# Patient Record
Sex: Male | Born: 1945 | Race: White | Hispanic: No | Marital: Married | Smoking: Current some day smoker
Health system: Southern US, Community
[De-identification: ages and names within clinical notes are randomized; demographics above are authoritative.]

## PROBLEM LIST (undated history)

## (undated) DIAGNOSIS — G4730 Sleep apnea, unspecified: Secondary | ICD-10-CM

## (undated) DIAGNOSIS — I2510 Atherosclerotic heart disease of native coronary artery without angina pectoris: Secondary | ICD-10-CM

## (undated) DIAGNOSIS — Z8659 Personal history of other mental and behavioral disorders: Secondary | ICD-10-CM

## (undated) DIAGNOSIS — J45909 Unspecified asthma, uncomplicated: Secondary | ICD-10-CM

## (undated) DIAGNOSIS — C61 Malignant neoplasm of prostate: Secondary | ICD-10-CM

## (undated) DIAGNOSIS — I252 Old myocardial infarction: Secondary | ICD-10-CM

## (undated) DIAGNOSIS — E119 Type 2 diabetes mellitus without complications: Secondary | ICD-10-CM

## (undated) DIAGNOSIS — Z8669 Personal history of other diseases of the nervous system and sense organs: Secondary | ICD-10-CM

## (undated) DIAGNOSIS — M1990 Unspecified osteoarthritis, unspecified site: Secondary | ICD-10-CM

## (undated) DIAGNOSIS — I10 Essential (primary) hypertension: Secondary | ICD-10-CM

## (undated) HISTORY — PX: TRANSURETHRAL RESECTION OF PROSTATE: SHX73

## (undated) HISTORY — PX: PROSTATE BIOPSY: SHX241

---

## 2008-06-09 HISTORY — PX: CORONARY ANGIOPLASTY WITH STENT PLACEMENT: SHX49

## 2010-03-29 ENCOUNTER — Ambulatory Visit (HOSPITAL_COMMUNITY): Admission: RE | Admit: 2010-03-29 | Discharge: 2010-03-30 | Payer: Self-pay | Admitting: Urology

## 2010-08-21 LAB — BASIC METABOLIC PANEL
BUN: 18 mg/dL (ref 6–23)
CO2: 28 mEq/L (ref 19–32)
Chloride: 99 mEq/L (ref 96–112)
Creatinine, Ser: 1.03 mg/dL (ref 0.4–1.5)
GFR calc Af Amer: >60 mL/min (ref >60)
Glucose, Bld: 159 mg/dL — ABNORMAL HIGH (ref 70–99)

## 2010-08-21 LAB — CBC
MCH: 29.6 pg (ref 26.0–34.0)
MCV: 86.5 fL (ref 78.0–100.0)
Platelets: 265 10*3/uL (ref 150–400)
RBC: 4.61 MIL/uL (ref 4.22–5.81)

## 2010-08-21 LAB — GLUCOSE, CAPILLARY
Glucose-Capillary: 104 mg/dL — ABNORMAL HIGH (ref 70–99)
Glucose-Capillary: 216 mg/dL — ABNORMAL HIGH (ref 70–99)

## 2012-01-18 IMAGING — CR DG CHEST 2V
2 series · 2 of 2 positions shown · non-contrast
Comparison: None

CLINICAL DATA: Preoperative respiratory examination for prostate
surgery.

CHEST - 2 VIEW

[w chest pa]
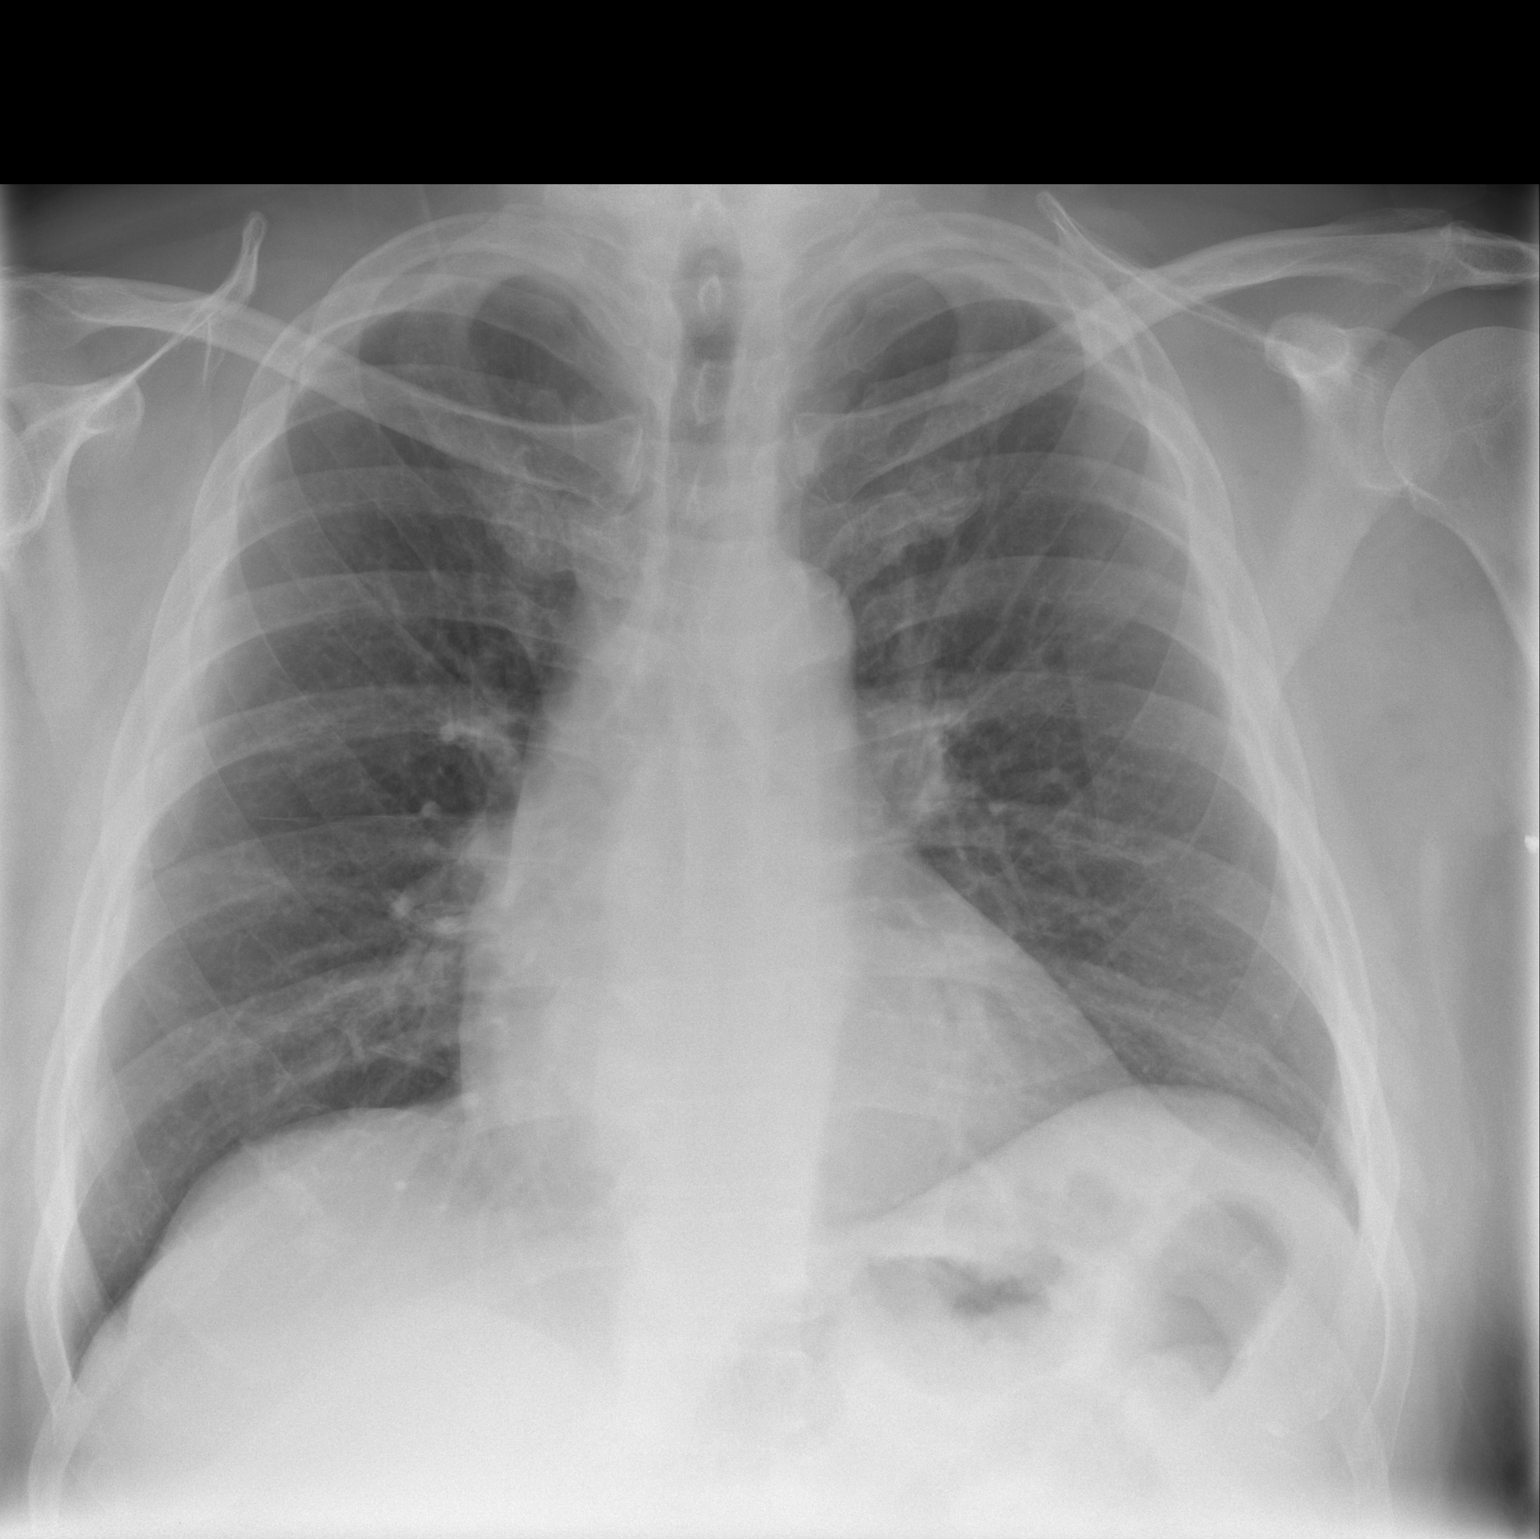

[w chest lat]
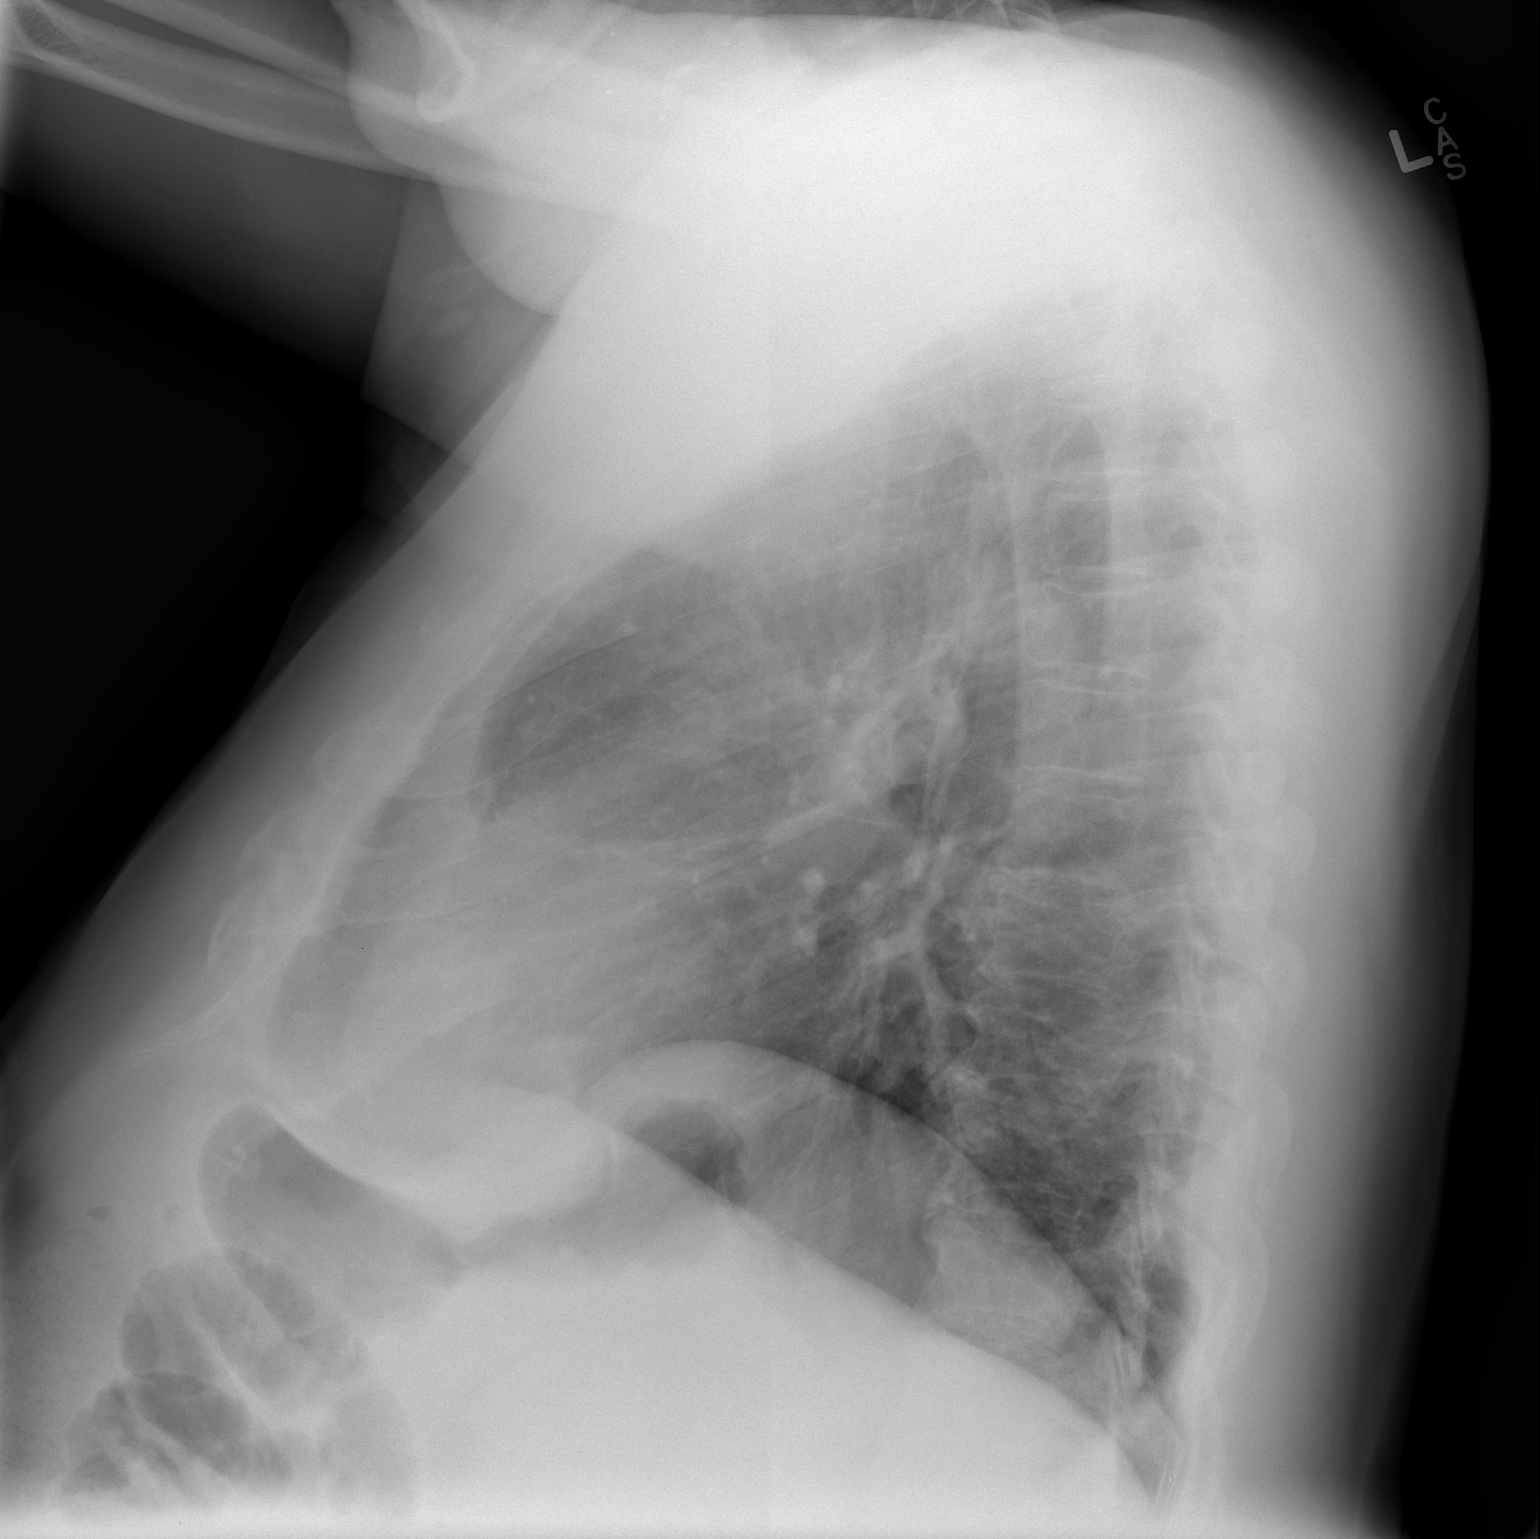

[2 of 2 positions shown; findings below may reference images not displayed]

FINDINGS: The cardiomediastinal silhouette is unremarkable.
The lungs are clear.
There is no evidence of focal airspace disease, pulmonary edema,
pleural effusion, or pneumothorax.
No acute bony abnormalities are identified.
IMPRESSION: No evidence of active cardiopulmonary disease.

## 2014-06-13 HISTORY — PX: PROSTATE BIOPSY: SHX241

## 2014-06-20 ENCOUNTER — Encounter: Payer: Self-pay | Admitting: Medical Oncology

## 2014-06-20 NOTE — [facility] Oncology Navigator Note (Signed)
I called Mr.Klahn and spoke with his wife. She states pt is out of town today but I can reach him around noon tomorrow.I will call patient back tomorrow to discuss his referral to the [facility].    Cira Rue, RN, BSN, [place]  [phone] Fax [phone]

## 2014-06-21 ENCOUNTER — Encounter: Payer: Self-pay | Admitting: Medical Oncology

## 2014-06-21 NOTE — [facility] Oncology Navigator Note (Signed)
I had called Mr. Cory Fields yesterday and requested he return my call. He call today and I  introduced myself as the Prostate Nurse Navigator and the Coordinator of the Prostate [place].  1. I confirmed with the patient he is aware of his referral to the clinic 07/04/14 at 12:30pm.   2. I discussed the format of the clinic and the physicians he will be seeing that day.  3. I discussed where the clinic is located and how to contact me.I asked him to please eat lunch before he comes due to the length of the clinic.  4. I confirmed his address and informed him I would be mailing a packet of information and forms to be completed. I asked him to bring them with him the day of his appointment.   He voiced understanding of the above. He asked me to give his wife my name and number which I did. I asked them both  to call me if they have any questions or concerns regarding his appointments or the forms he needs to complete.They both voiced understanding.  Cira Rue, RN, BSN, [place]  [phone]  Fax [phone]

## 2014-07-03 ENCOUNTER — Encounter: Payer: Self-pay | Admitting: Medical Oncology

## 2014-07-03 ENCOUNTER — Encounter: Payer: Self-pay | Admitting: Radiation Oncology

## 2014-07-03 NOTE — Progress Notes (Addendum)
GU Location of Tumor / Histology: Prostate Cancer  06/13/14 Biopsy     If Prostate Cancer, Gleason Score is (3+ 4) and PSA is (1.86), I-PSS score 30, SHIM score 18  Cory Fields was diagnosed with low risk prostate cancer "about 2009 and began active surveillance with [facility] until transferring his care to Dr. Raynelle Bring in June of 2011   Past/Anticipated interventions by urology, if any: Prostate Biopsy  Past/Anticipated interventions by medical oncology, if any:   Weight changes, if any: patient states he has gained 5 lbs according to todays weight , but had on clothes and shoes  Bowel/Bladder complaints, if any: "Longstanding BPH".  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  Symptoms worsened and had a repeat urodynamic study.  On Finasteride, and Tamsulosin restarted in April 2015  Nausea/Vomiting, if any: No  Pain issues, if any: No  SAFETY ISSUES:  Prior radiation? NO  Pacemaker/ICD?No  Possible current pregnancy? N/A  Is the patient on methotrexate? No  Current Complaints / other details:

## 2014-07-03 NOTE — [facility] Oncology Navigator Note (Signed)
I called Mr. Regina and spoke his wife. I confirmed pt's appointment for the [facility] 07/04/14 to arrive at 12:30pm. I explained that they will register in Radiation Oncology and someone at the front desk will direct them. I asked them to bring his completed medical forms and to eat lunch due to the length of the clinic. We reviewed the directions to [facility] and I asked her to call me with any questions or concerns before tomorrow's appointment. She voiced understanding.     Cira Rue, RN, BSN, [place]  [phone] Fax [phone]

## 2014-07-04 ENCOUNTER — Encounter: Payer: Self-pay | Admitting: Radiation Oncology

## 2014-07-04 ENCOUNTER — Ambulatory Visit (HOSPITAL_BASED_OUTPATIENT_CLINIC_OR_DEPARTMENT_OTHER): Payer: Medicare Other | Admitting: Oncology

## 2014-07-04 ENCOUNTER — Ambulatory Visit
Admission: RE | Admit: 2014-07-04 | Discharge: 2014-07-04 | Disposition: A | Discharge status: Home / Self Care | Payer: Medicare Other | Source: Ambulatory Visit | Attending: Radiation Oncology | Admitting: Radiation Oncology

## 2014-07-04 ENCOUNTER — Encounter: Payer: Self-pay | Admitting: Medical Oncology

## 2014-07-04 VITALS — BP 120/70 | HR 93 | Temp 98.4°F | Ht 70.0 in | Wt 247.8 lb

## 2014-07-04 DIAGNOSIS — I2510 Atherosclerotic heart disease of native coronary artery without angina pectoris: Secondary | ICD-10-CM

## 2014-07-04 DIAGNOSIS — N3281 Overactive bladder: Secondary | ICD-10-CM | POA: Diagnosis not present

## 2014-07-04 DIAGNOSIS — F329 Major depressive disorder, single episode, unspecified: Secondary | ICD-10-CM | POA: Diagnosis not present

## 2014-07-04 DIAGNOSIS — Z794 Long term (current) use of insulin: Secondary | ICD-10-CM | POA: Diagnosis not present

## 2014-07-04 DIAGNOSIS — I252 Old myocardial infarction: Secondary | ICD-10-CM | POA: Diagnosis not present

## 2014-07-04 DIAGNOSIS — E119 Type 2 diabetes mellitus without complications: Secondary | ICD-10-CM | POA: Insufficient documentation

## 2014-07-04 DIAGNOSIS — C61 Malignant neoplasm of prostate: Secondary | ICD-10-CM

## 2014-07-04 DIAGNOSIS — F17210 Nicotine dependence, cigarettes, uncomplicated: Secondary | ICD-10-CM | POA: Diagnosis not present

## 2014-07-04 DIAGNOSIS — Z7902 Long term (current) use of antithrombotics/antiplatelets: Secondary | ICD-10-CM | POA: Diagnosis not present

## 2014-07-04 DIAGNOSIS — Z9861 Coronary angioplasty status: Secondary | ICD-10-CM | POA: Diagnosis not present

## 2014-07-04 DIAGNOSIS — I10 Essential (primary) hypertension: Secondary | ICD-10-CM

## 2014-07-04 DIAGNOSIS — Z7982 Long term (current) use of aspirin: Secondary | ICD-10-CM | POA: Diagnosis not present

## 2014-07-04 DIAGNOSIS — G4730 Sleep apnea, unspecified: Secondary | ICD-10-CM | POA: Insufficient documentation

## 2014-07-04 DIAGNOSIS — J45909 Unspecified asthma, uncomplicated: Secondary | ICD-10-CM | POA: Diagnosis not present

## 2014-07-04 HISTORY — DX: Unspecified asthma, uncomplicated: J45.909

## 2014-07-04 HISTORY — DX: Type 2 diabetes mellitus without complications: E11.9

## 2014-07-04 HISTORY — DX: Essential (primary) hypertension: I10

## 2014-07-04 HISTORY — DX: Personal history of other diseases of the nervous system and sense organs: Z86.69

## 2014-07-04 HISTORY — DX: Unspecified osteoarthritis, unspecified site: M19.90

## 2014-07-04 HISTORY — DX: Sleep apnea, unspecified: G47.30

## 2014-07-04 HISTORY — DX: Malignant neoplasm of prostate: C61

## 2014-07-04 HISTORY — DX: Old myocardial infarction: I25.2

## 2014-07-04 HISTORY — DX: Personal history of other mental and behavioral disorders: Z86.59

## 2014-07-04 HISTORY — DX: Atherosclerotic heart disease of native coronary artery without angina pectoris: I25.10

## 2014-07-04 NOTE — [facility] Oncology Navigator Note (Signed)
                               Care Plan Summary  Name: Cory Fields DOB: [birth year]   Your Medical Team:   Urologist -  Dr. Raynelle Bring, [facility]  Radiation Oncologist - Dr. Arloa Koh, Arloa Koh, [facility]   Medical Oncologist - Dr. Zola Button, [place]  Recommendations: 1) Continue Surveillance 2) Cryotherapy   * These recommendations are based on information available as of today's consult.      Recommendations may change depending on the results of further tests or exams.  Next Steps: 1) Pt wants to continue surveillance 2) Dr. Lynne Logan office will call you for follow up appointment.  When appointments need to be scheduled, you will be contacted by [facility] and/or [facility].  Questions?  Please do not hesitate to call Cira Rue, RN, BSN, CRNI at [phone] any questions or concerns.  Shirlean Mylar is your Oncology Nurse Navigator and is available to assist you while you're receiving your medical care at [facility].

## 2014-07-04 NOTE — Consult Note (Signed)
Reason for Referral: Prostate cancer.   HPI: 69 year old gentleman native of [place] where he lived the majority of his life. He has a history of multiple comorbid conditions include diabetes, hypertension, obesity and coronary artery disease. His initial diagnosis with prostate cancer dates back to 2009 where he was diagnosed with low risk prostate cancer and the [facility]. He established care with Dr. Alinda Money and have been on active surveillance since that time. A repeat biopsy on August 2011 showed a Gleason score 3+3 = 6 in 3 out of 12 cores. In September 2013 his Gleason score remainder 3+3 = 6 and his low-volume disease remained in 3 out of 12 cores. His most recent repeat biopsy did show a Gleason score 4+3 = 7 in one of the cores. He also continues to have a low volume disease in the remainder of his biopsies. His PSA weight between 1-2 most recently was 1.88. He does have symptoms of BPH and lower urinary tract symptoms probably related to bladder dysfunction and have been on finasteride and tamsulosin. Otherwise he is really asymptomatic from his prostate cancer. He does not report any constitutional symptoms. She does not report any specific pain or pathological fractures. Continue to be relatively active able to drive and attends to activities of daily living.   Past Medical History  Diagnosis Date  . Prostate cancer   . Sleep apnea   . Asthma   . Hypertension   . Diabetes   . CAD (coronary artery disease)   . Hx of myocardial infarction   . Hx of anxiety disorder   . Arthritis   . Hx of major depression   . Hx of glaucoma   :  Past Surgical History  Procedure Laterality Date  . Coronary angioplasty with stent placement  2010  . Prostate biopsy  2009, 2011, 2013,   . Prostate biopsy  06/13/14  . Transurethral resection of prostate    :   Current outpatient prescriptions:  .  albuterol (PROVENTIL HFA;VENTOLIN HFA) 108 (90 BASE) MCG/ACT inhaler, Inhale into the lungs every 6  (six) hours as needed for wheezing or shortness of breath., Disp: , Rfl:  .  ARIPiprazole (ABILIFY) 20 MG tablet, Take 20 mg by mouth daily., Disp: , Rfl:  .  aspirin EC 81 MG tablet, Take 81 mg by mouth daily., Disp: , Rfl:  .  DULoxetine (CYMBALTA) 60 MG capsule, Take 60 mg by mouth daily., Disp: , Rfl:  .  finasteride (PROSCAR) 5 MG tablet, Take 5 mg by mouth daily., Disp: , Rfl:  .  glipiZIDE (GLUCOTROL XL) 5 MG 24 hr tablet, Take 5 mg by mouth daily with breakfast., Disp: , Rfl:  .  Insulin Glargine (LANTUS) 100 UNIT/ML Solostar Pen, Inject 71 Units into the skin daily at 10 pm. , Disp: , Rfl:  .  lisinopril-hydrochlorothiazide (PRINZIDE,ZESTORETIC) 20-25 MG per tablet, Take 1 tablet by mouth daily., Disp: , Rfl:  .  metoprolol tartrate (LOPRESSOR) 25 MG tablet, Take 25 mg by mouth once. Take 12.5 mg po daily, Disp: , Rfl:  .  pregabalin (LYRICA) 50 MG capsule, Take 50 mg by mouth 3 (three) times daily., Disp: , Rfl:  .  primidone (MYSOLINE) 50 MG tablet, Take by mouth 4 (four) times daily., Disp: , Rfl:  .  ranitidine (ZANTAC) 150 MG tablet, Take 150 mg by mouth daily as needed for heartburn., Disp: , Rfl:  .  rosuvastatin (CRESTOR) 20 MG tablet, Take 40 mg by mouth daily. , Disp: ,  Rfl:  .  traMADol (ULTRAM) 50 MG tablet, Take by mouth every 6 (six) hours as needed., Disp: , Rfl:  .  travoprost, benzalkonium, (TRAVATAN) 0.004 % ophthalmic solution, 1 drop at bedtime. Both eyes, Disp: , Rfl:  .  Vitamin D, Ergocalciferol, (DRISDOL) 50000 UNITS CAPS capsule, Take 50,000 Units by mouth every 7 (seven) days., Disp: , Rfl: :  No Known Allergies:  Family History  Problem Relation Age of Onset  . Heart disease Mother   . Stroke Father   :  History   Social History  . Marital Status: Married    Spouse Name: N/A    Number of Children: N/A  . Years of Education: N/A   Occupational History  . Not on file.   Social History Main Topics  . Smoking status: Current Some Day Smoker  .  Smokeless tobacco: Not on file  . Alcohol Use: 0.0 oz/week    0 Not specified per week  . Drug Use: Not on file  . Sexual Activity: Not on file   Other Topics Concern  . Not on file   Social History Narrative  :  Pertinent items are noted in HPI.  Exam: ECOG 1 There were no vitals taken for this visit. General appearance: alert and cooperative Head: Normocephalic, without obvious abnormality Throat: lips, mucosa, and tongue normal; teeth and gums normal Neck: no adenopathy Back: negative Resp: clear to auscultation bilaterally Extremities: extremities normal, atraumatic, no cyanosis or edema Pulses: 2+ and symmetric Skin: Skin color, texture, turgor normal. No rashes or lesions    Assessment and Plan:   69 year old gentleman diagnosed with low-grade prostate cancer in 2009 and have been on active surveillance since that time. He has had Gleason score mostly of 3+3 = 6 with a PSA of around 1.88 but most recently in January 2016 and a repeat biopsy that showed a Gleason score of 4+3 = 7. His case was presented today in the prostate cancer multidisciplinary clinic and his pathology was reviewed with the reviewing pathologist. Options of treatments were discussed including observation and surveillance, external beam radiation and possible cryotherapy. After discussion with the patient today in discussing the risks and benefits of all these approaches and given his comorbidities is very possible that he could develop worsening complications and morbidities related to his other medical conditions and prostate cancer. For the time being he elected with observation and surveillance currently. I see no intervention is needed from medical oncology standpoint.

## 2014-07-04 NOTE — Addendum Note (Signed)
Encounter addended by: Deirdre Evener, RN on: 07/04/2014  7:11 PM<BR>     Documentation filed: Notes Section, Inpatient Document Flowsheet

## 2014-07-04 NOTE — Progress Notes (Signed)
Prostate cancer 

## 2014-07-04 NOTE — Addendum Note (Signed)
Encounter addended by: Deirdre Evener, RN on: 07/04/2014  5:27 PM<BR>     Documentation filed: Charges VN, Medications

## 2014-07-04 NOTE — Progress Notes (Signed)
[facility] NEW PATIENT EVALUATION  Name: Cory Fields MRN: [id number]  Date:   07/04/2014           DOB: August 30, 1945  Status: outpatient   CC: No primary care provider on file.  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 male who is a pleasant 69 year old male from [place], [place], who is seen at the prostate multidisciplinary clinic with Dr. Alinda Money and Dr. 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  His history is well summarized in Dr. Lynne Logan office notes.  He was initially diagnosed with low-risk disease in 2009 at a [facility].  Dr. Alinda Money assumed his care 2011.  He elected for active surveillance.  Repeat biopsies August 2011 and September 2013 again showed Gleason 6, favorable risk disease.  He has a history of multiple medical comorbidities including coronary artery disease.  He also has a history of lower urinary tract symptoms which are now felt to be secondary to bladder hyperactivity rather than obstruction after urodynamics.  He was placed on finasteride and his PSA from 04/05/2014 was 1.86.  Repeat biopsies by Dr. Alinda Money on 06/13/2014 showed Gleason 7 (4+3) involving 60% of one core from the right mid gland.  He also had Gleason 7 (3+4) involving 20% of one core from right lateral apex, 60% of one core from the left lateral mid gland, less than 5% of one core from left lateral apex and 10% of one core from left apex.  He Gleason 6 (3+3) involving 5% of one core from left mid gland.  His prostate volume was only 12.5 mL.  He continues to have significant lower urinary tract symptoms and his I PSS score is 30.  He was placed on the Myrbetriq with some improvement, and he ran out of his sample medication yesterday.  PREVIOUS RADIATION THERAPY: No   PAST MEDICAL HISTORY:   has a past medical history of Prostate cancer; Sleep apnea; Asthma; Hypertension; Diabetes; CAD (coronary artery disease); myocardial infarction; anxiety disorder; Arthritis; major depression; and glaucoma.     PAST SURGICAL HISTORY:  Past Surgical History  Procedure Laterality Date  . Coronary angioplasty with stent placement  2010  . Prostate biopsy  2009, 2011, 2013,   . Prostate biopsy  06/13/14  . Transurethral resection of prostate       FAMILY HISTORY: family history includes Heart disease in his mother; Stroke in his father.   SOCIAL HISTORY:  reports that he has been smoking.  He does not have any smokeless tobacco history on file. He reports that he drinks alcohol.   ALLERGIES: Review of patient's allergies indicates no known allergies.   MEDICATIONS:  Current Outpatient Prescriptions  Medication Sig Dispense Refill  . ARIPiprazole (ABILIFY) 20 MG tablet Take 20 mg by mouth daily.    Marland Kitchen aspirin EC 81 MG tablet Take 81 mg by mouth daily.    . DULoxetine (CYMBALTA) 60 MG capsule Take 60 mg by mouth daily.    . finasteride (PROSCAR) 5 MG tablet Take 5 mg by mouth daily.    Marland Kitchen glipiZIDE (GLUCOTROL XL) 5 MG 24 hr tablet Take 5 mg by mouth daily with breakfast.    . Insulin Glargine (LANTUS) 100 UNIT/ML Solostar Pen Inject 71 Units into the skin daily at 10 pm.     .  lisinopril-hydrochlorothiazide (PRINZIDE,ZESTORETIC) 20-25 MG per tablet Take 1 tablet by mouth daily.    . metoprolol tartrate (LOPRESSOR) 25 MG tablet Take 25 mg by mouth once. Take 12.5 mg po daily    . pregabalin (LYRICA) 50 MG capsule Take 50 mg by mouth 3 (three) times daily.    . primidone (MYSOLINE) 50 MG tablet Take by mouth 4 (four) times daily.    . ranitidine (ZANTAC) 150 MG tablet Take 150 mg by mouth daily as needed for heartburn.    . rosuvastatin (CRESTOR) 20 MG tablet Take 40 mg by mouth daily.     . traMADol (ULTRAM) 50 MG tablet Take by mouth every 6 (six) hours as needed.    . travoprost,  benzalkonium, (TRAVATAN) 0.004 % ophthalmic solution 1 drop at bedtime. Both eyes    . Vitamin D, Ergocalciferol, (DRISDOL) 50000 UNITS CAPS capsule Take 50,000 Units by mouth every 7 (seven) days.    Marland Kitchen albuterol (PROVENTIL HFA;VENTOLIN HFA) 108 (90 BASE) MCG/ACT inhaler Inhale into the lungs every 6 (six) hours as needed for wheezing or shortness of breath.     No current facility-administered medications for this encounter.     REVIEW OF SYSTEMS:  Pertinent items are noted in HPI.    PHYSICAL EXAM:  height is 5\' 10"  (1.778 m) and weight is 247 lb 12.8 oz (112.401 kg). His temperature is 98.4 F (36.9 C). His blood pressure is 120/70 and his pulse is 93.   He is not examined today.   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  The patient has significant medical comorbidities including coronary artery disease.  He continues to smoke.  We all discussed surgery versus external beam radiation therapy versus cryotherapy versus continued close observation.  After lengthy discussions he is most interested in continued close observation/active surveillance.  If he decides on radiation therapy in the future, he would be better served by going to [place]: Continued follow-up with Dr. Alinda Money.

## 2014-07-04 NOTE — Addendum Note (Signed)
Encounter addended by: Deirdre Evener, RN on: 07/04/2014  5:29 PM<BR>     Documentation filed: Arn Medal VN

## 2014-07-04 NOTE — Addendum Note (Signed)
Encounter addended by: Raynelle Bring, MD on: 07/04/2014  9:04 PM<BR>     Documentation filed: Clinical Notes

## 2014-07-04 NOTE — Consult Note (Signed)
Chief Complaint  Prostate Cancer   Reason For Visit  Reason for consult: To discuss treatment/management options for prostate cancer. Location of consult: [facility] PCP: Dr. Alroy Dust   History of Present Illness  Mr. Main is a 69 year old who has been under my care since June 2011 with the following urologic history:  1) Prostate cancer: He was diagnosed with low risk prostate cancer in about 2009 under the care of Dr. Yehuda Budd at the [place]. He has been undergoing active surveillance considering his significant comorbidities. He presented to me for his initial consultation in June 2011 and transferred his urologic care here.  He had not undergone surveillance biopsies at that time. His baseline PSA at his first visit with me in 2011 was 2.56.  Initial diagnosis: February 2009 TNM stage: cT1c Nx Mx PSA at diagnosis: ? Gleason score: 3+4=7 (3/6 cores, low volume)  Surveillance biopsies: Aug 2011: 3/12 cores -- L apex (5%, 3+3=6), L lateral mid (50%, 3+3=6), R mid (5%, 3+3=6), Vol 31.2 cc Sep 2013: 3/12 cores -- L lateral apex (30%, 3+3=6), L lateral mid (5%, 3+3=6, PNI), R mid (80% DI, 3+3=6), Vol 17.2 cc Jan 2016: 6/12 cores -- L lateral apex (<5%, 3+4=7), L apex (10%, 3+4=7), L lateral mid (60%, 3+4=7), L mid (5%, 3+3=6), R lateral apex (20%, 3+4=7), R mid (60%, 4+3=7), Vol 12.5 cc  2) BPH/LUTS: He has long standing BPH and has been treated with combination medical therapy including finasteride. His symptoms worsened in 2011. Baseline IPSS at his initial evaluation by me was 27. He underwent a urodynamic study and cystoscopy in June 2011 which demonstrated bladder outlet obstruction likely secondary to BPH. He underwent a TUVP in October 2011. His symptoms worsened in 2015 prompting repeat urodynamic evaluation. He was not felt to have obstruction and was treated with Myrbetriq with significant improvement. Current treatment:  Finasteride, Tamsulosin (restarted in Apr 2015)  ** Pertinent medical history: He has multiple comorbidities. - CAD s/p drug-eluting cardiac stent in the summer of 2010 (on chronic Plavix and ASA), history of MI, cardiologist is Dr. Alroy Dust in [place] - Sleep apnea, asthma - Diabetes - HTN  Interval history:  Mr. Tebbetts follows up today in the company of his wife after his recent prostate biopsy indicating progression of his prostate cancer.  He tolerated this biopsy well and has recovered well.  With regard to his voiding situation, he did fine Myrbetriq 25 mg to be helpful and significantly more so than Toviaz 4 mg.  Currently, his IPSS is 30 therapy but feels that his voiding symptoms were very tolerable with Myrbetriq.     Past Medical History  1. History of Acute Myocardial Infarction  2. History of Anxiety (F41.9)  3. History of Arthritis  4. History of Asthma (J45.909)  5. History of Coronary Artery Disease  6. History of depression (Z86.59)  7. History of diabetes mellitus (Z86.39)  8. History of glaucoma (Z86.69)  9. History of hyperlipidemia (Z86.39)  10. History of hypertension (Z86.79)  11. History of sleep apnea (Z87.09)  12. Prostate cancer (C61)  Surgical History  1. History of Cath Stent Placement  2. History of Transurethral Resection Of Prostate (TURP)  Current Meds  1. Abilify 20 MG Oral Tablet;  Therapy: (Recorded:01Jun2011) to Recorded  2. Albuterol 90 MCG/ACT AERS;  Therapy: (Recorded:01Jun2011) to Recorded  3. Aspir-81 81 MG Oral Tablet Delayed Release;  Therapy: (Recorded:01Jun2011) to Recorded  4. Carbidopa-Levodopa 25-100 MG Oral  Tablet;  Therapy: (Recorded:18Mar2014) to Recorded  5. Crestor 20 MG Oral Tablet;  Therapy: (Recorded:01Jun2011) to Recorded  6. Cymbalta 60 MG Oral Capsule Delayed Release Particles;  Therapy: (Recorded:01Jun2011) to Recorded  7. Depakote 500 MG Oral Tablet Delayed Release;  Therapy: (Recorded:01Jun2011) to Recorded   8. Ergocalciferol 50000 UNIT Oral Capsule;  Therapy: (Recorded:01Jun2011) to Recorded  9. Finasteride 5 MG Oral Tablet; Take 1 tablet every day  Requested for: [id number]; Last  Rx:18Mar2014 Ordered  10. Fish Oil CAPS;   Therapy: (Recorded:01Jun2011) to Recorded  11. Foradil Aerolizer 12 MCG Inhalation Capsule;   Therapy: (Recorded:01Jun2011) to Recorded  12. Gabapentin 300 MG TABS;   Therapy: (Recorded:01Jun2011) to Recorded  13. Gemfibrozil 600 MG Oral Tablet;   Therapy: (Recorded:01Jun2011) to Recorded  14. GlipiZIDE ER 5 MG Oral Tablet Extended Release 24 Hour;   Therapy: (Recorded:01Jun2011) to Recorded  15. Lantus 100 UNIT/ML Subcutaneous Solution;   Therapy: (Recorded:01Jun2011) to Recorded  16. Levofloxacin 500 MG Oral Tablet; take one tab by mouth the day before procedure, take   one tab by mouth the day of procedure, take one tab by mouth the after the procedure;   Therapy: 28Oct2015 to (Last Rx:31Dec2015)  Requested for: 31Dec2015 Ordered  17. Lisinopril-HCTZ 25-20 MG TABS;   Therapy: (Recorded:01Jun2011) to Recorded  18. Metoprolol Tartrate 25 MG Oral Tablet;   Therapy: (Recorded:01Jun2011) to Recorded  19. Nasonex 50 MCG/ACT Nasal Suspension;   Therapy: (Recorded:01Jun2011) to Recorded  20. Primidone TABS;   Therapy: (Recorded:18Mar2014) to Recorded  21. Ranitidine HCl - 150 MG Oral Tablet;   Therapy: (Recorded:01Jun2011) to Recorded  22. Tamsulosin HCl - 0.4 MG Oral Capsule; Take 1 capsule by mouth at bedtime;   Therapy: 24Sep2014 to (Evaluate:25Sep2015)  Requested for: 30Sep2014; Last   Rx:30Sep2014 Ordered  23. TraMADol HCl - 50 MG Oral Tablet;   Therapy: (Recorded:01Jun2011) to Recorded  24. Travatan Z 0.004 % Ophthalmic Solution;   Therapy: (Recorded:01Jun2011) to Recorded  25. Wellbutrin SR 100 MG Oral Tablet Extended Release 12 Hour;   Therapy: (Recorded:01Jun2011) to Recorded  Allergies  1. No Known Drug Allergies  Family History  1. Family history of  Heart Disease : Mother  2. Denied: Family history of Prostate Cancer  3. Family history of Stroke Syndrome : Father  Social History   Alcohol Use   Current some day smoker (F17.210)   Marital History - Currently Married   Tobacco Use  Physical Exam Constitutional: Well nourished and well developed . No acute distress.    Results/Data  I have independently reviewed Mr. Rebuck's medical records, PSA results, pathology reports and slides, etc. today.  Findings are as outlined above.     Assessment  1. Prostate cancer (C61)  Discussion/Summary  1.  Prostate cancer: I had a long and detailed discussion with Mr. Burciaga and his wife today regarding his prostate cancer situation.  We discussed his recent pathology report and how he has had evidence for clinical progression based on both volume of disease and upgrading.  We discussed how this potentially changes his prognosis with regard to the natural history of his prostate cancer.  We also further discussed his overall life expectancy based on his significant medical comorbidities.  It was explained that treatment of curative intent for prostate cancer could result in cancer cure albeit with the potential for significant side effects.  There was also discussed that his life expectancy may be such that treatment may not be necessary for his prostate cancer even considering the clinical progression  noted on his last biopsy.  We therefore discussed all available options.  Considering his comorbidities, I did not recommend surgical therapy.  We did discuss the option of radiotherapy.  Considering his small prostate size and significant voiding symptoms as well as his disease parameter, he is not an appropriate candidate for brachytherapy.  With regard to external beam radiation therapy, this would be an appropriate treatment for his cancer although very well may significantly exacerbate some of his lower urinary tract symptoms which are extremely  bothersome to him.  He did not express interest in any treatment that would worsen his voiding symptoms.  He also discussed the option of primary cryotherapy which may offer the benefits of curative therapy albeit with a lower urinary toxicity profile with the ability to spare any effects on the bladder.  Finally, we discussed the role of continued active surveillance.  He was most interested in continuing surveillance and agreed that considering his significant medical comorbidities, his life expectancy may be limited enough that treatment is likely unnecessary.  He does express understanding of the potential risk of cancer progression while under surveillance and the possibility that he could develop metastatic, incurable disease.  He understands that systemic therapies would be available for treatment in that situation and he understands that these treatments while beneficial and with the ability to extend survival, are not curative treatments.  He wishes to give further consideration to his options but is going to strongly consider active surveillance versus the possibility of primary cryotherapy.  He will consider his options and notify me how he would like to proceed.  2.  LUTS: His symptoms definitely appear to be related to an overactive bladder considering his recent urodynamic study that did not demonstrate evidence of obstruction.  Furthermore, he has had a positive response to Myrbetriq.  I therefore recommended that he stop finasteride.  He also understands that he can try to stop tamsulosin although should resume this if his symptoms worsen off therapy.  Finally, I recommended that we try to call him in a prescription for Myrbetriq 50 mg to see if this might optimize his treatment.   A total of 48 minutes were spent in the overall care of the patient today with 48 minutes in direct face to face consultation.    Signatures Electronically signed by : Raynelle Bring, M.D.; Jul 04 2014  9:03PM  EST

## 2014-07-05 ENCOUNTER — Encounter: Payer: Self-pay | Admitting: Specialist

## 2014-07-05 NOTE — Progress Notes (Signed)
[facility] Psychosocial Distress Screening Clinical Social Work  Chaplain utilized distress screening protocol.  The patient scored a 0 on the Psychosocial Distress Thermometer which indicates 0 distress. He denied having any distress. I gave him educational materials about the support center programs and services including the prostate cancer support group flyer. He said he probably would not use the services since he lives in [place] Worker follow up needed: No.  If yes, follow up plan:   [place] 07/05/2014  Screening Type   Distress experienced in past week (1-10) 0  Physical Problem type   Physician notified of physical symptoms Yes  Referral to support programs Yes

## 2014-10-04 ENCOUNTER — Telehealth: Payer: Self-pay | Admitting: Medical Oncology

## 2014-10-04 NOTE — Telephone Encounter (Signed)
I called Mr. Blatz and left a message asking him to return my call. I met him 07/04/14 in the Prostate [place] and this is the 3 month follow up call.     Cira Rue, RN, BSN, [place]  [phone]  Fax [phone]

## 2014-10-11 NOTE — Telephone Encounter (Signed)
I spoke with patient and he states he is doing well. No question or concerns and he does have a follow up appointment with Dr. Alinda Money. I asked him to call me with any questions or concerns.    Cira Rue, RN, BSN, [place]  [phone] Fax [phone]

## 2015-01-03 ENCOUNTER — Telehealth: Payer: Self-pay | Admitting: Medical Oncology

## 2015-01-03 NOTE — Telephone Encounter (Signed)
Oncology Nurse Navigator Documentation  Oncology Nurse Navigator Flowsheets 01/03/2015  Navigator Encounter Type Telephone;6 month- ask Cory Fields to return my call to follow up 6 month post Prostate [place]  Time Spent with Patient (No Data)

## 2022-06-09 DEATH — deceased
